# Patient Record
Sex: Male | Born: 1985 | Race: Black or African American | Hispanic: No | Marital: Single | Smoking: Former smoker
Health system: Southern US, Community
[De-identification: ages and names within clinical notes are randomized; demographics above are authoritative.]

## PROBLEM LIST (undated history)

## (undated) DIAGNOSIS — I10 Essential (primary) hypertension: Secondary | ICD-10-CM

## (undated) HISTORY — PX: CYST EXCISION: SHX5701

---
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
## 2008-04-28 ENCOUNTER — Emergency Department (HOSPITAL_COMMUNITY): Admission: EM | Admit: 2008-04-28 | Discharge: 2008-04-28 | Payer: Self-pay | Admitting: Family Medicine

## 2009-10-25 ENCOUNTER — Emergency Department (HOSPITAL_COMMUNITY): Admission: EM | Admit: 2009-10-25 | Discharge: 2009-10-25 | Payer: Self-pay | Admitting: Family Medicine

## 2014-11-11 ENCOUNTER — Encounter (HOSPITAL_COMMUNITY): Payer: Self-pay

## 2014-11-11 ENCOUNTER — Emergency Department (HOSPITAL_COMMUNITY): Payer: Self-pay

## 2014-11-11 ENCOUNTER — Emergency Department (HOSPITAL_COMMUNITY)
Admission: EM | Admit: 2014-11-11 | Discharge: 2014-11-11 | Disposition: A | Discharge status: Home / Self Care | Payer: Self-pay | Attending: Emergency Medicine | Admitting: Emergency Medicine

## 2014-11-11 DIAGNOSIS — Y998 Other external cause status: Secondary | ICD-10-CM | POA: Insufficient documentation

## 2014-11-11 DIAGNOSIS — Z87891 Personal history of nicotine dependence: Secondary | ICD-10-CM | POA: Insufficient documentation

## 2014-11-11 DIAGNOSIS — X58XXXA Exposure to other specified factors, initial encounter: Secondary | ICD-10-CM | POA: Insufficient documentation

## 2014-11-11 DIAGNOSIS — Y9289 Other specified places as the place of occurrence of the external cause: Secondary | ICD-10-CM | POA: Insufficient documentation

## 2014-11-11 DIAGNOSIS — S93601A Unspecified sprain of right foot, initial encounter: Secondary | ICD-10-CM | POA: Insufficient documentation

## 2014-11-11 DIAGNOSIS — Y9339 Activity, other involving climbing, rappelling and jumping off: Secondary | ICD-10-CM | POA: Insufficient documentation

## 2014-11-11 NOTE — ED Provider Notes (Signed)
CSN: [id number]     Arrival date & time 11/11/14  1349 History  This chart was scribed for Marlon Pel, PA-C, working with Nelva Nay, MD by Chestine Spore, ED Scribe. The patient was seen in room TR11C/TR11C at 3:00 PM.    Chief Complaint  Patient presents with  . Foot Injury      The history is provided by the patient. No language interpreter was used.    HPI Comments: Jose Clayton is a 29 y.o. male who presents to the Emergency Department complaining of right foot injury onset last night. Pt reports that he was dancing at the time and he jumped high and when he landed he twisted his foot. Pt states that he is having pain with ambulating but he is able to ambulate. He states that he is having associated symptoms of joint swelling. He denies color change, wound, rash, numbness, weakness, and any other symptoms. No head/neck injury, loc or syncope. Pt works at [employer].  History reviewed. No pertinent past medical history. Past Surgical History  Procedure Laterality Date  . Cyst excision      penis   History reviewed. No pertinent family history. History  Substance Use Topics  . Smoking status: Former Games developer  . Smokeless tobacco: Not on file  . Alcohol Use: 1.8 oz/week    3 Cans of beer per week    Review of Systems  Musculoskeletal: Positive for joint swelling and arthralgias.  Skin: Negative for color change, rash and wound.  Neurological: Negative for weakness and numbness.   Allergies  Review of patient's allergies indicates no known allergies.  Home Medications   Prior to Admission medications   Not on File   BP 143/94 mmHg  Pulse 96  Temp(Src) 98.1 F (36.7 C) (Oral)  Resp 16  Ht  (1.575 m)  Wt 135 lb (61.236 kg)  BMI 24.69 kg/m2  SpO2 97% Physical Exam  Constitutional: He is oriented to person, place, and time. He appears well-developed and well-nourished. No distress.  HENT:  Head: Normocephalic and atraumatic.  Eyes: EOM are normal.  Neck:  Neck supple. No tracheal deviation present.  Cardiovascular: Normal rate.   Pulmonary/Chest: Effort normal. No respiratory distress.  Musculoskeletal: Normal range of motion.       Right foot: There is tenderness, bony tenderness and swelling. There is normal range of motion, normal capillary refill, no crepitus, no deformity and no laceration.       Feet:  Neurological: He is alert and oriented to person, place, and time.  Skin: Skin is warm and dry.  Psychiatric: He has a normal mood and affect. His behavior is normal.  Nursing note and vitals reviewed.   ED Course  Procedures (including critical care time) DIAGNOSTIC STUDIES: Oxygen Saturation is 97% on RA, nl by my interpretation.    COORDINATION OF CARE: 3:02 PM-Discussed treatment plan with pt at bedside and pt agreed to plan.   Labs Review Labs Reviewed - No data to display  Imaging Review Dg Foot Complete Right  11/11/2014   CLINICAL DATA:  Right foot injury/pain  EXAM: RIGHT FOOT COMPLETE - 3+ VIEW  COMPARISON:  None.  FINDINGS: No fracture or dislocation is seen.  The joint spaces are preserved.  The visualized soft tissues are unremarkable.  IMPRESSION: No fracture or dislocation is seen.   Electronically Signed   By: Charline Bills M.D.   On: 11/11/2014 14:47     EKG Interpretation None  MDM   Final diagnoses:  Foot sprain, right, initial encounter    ASO ankle and crutches applied to right foot, referral to Ortho. No acute findings and no fracture on xray.  Medications - No data to display  29 y.o.Jose Clayton's evaluation in the Emergency Department is complete. It has been determined that no acute conditions requiring further emergency intervention are present at this time. The patient/guardian have been advised of the diagnosis and plan. We have discussed signs and symptoms that warrant return to the ED, such as changes or worsening in symptoms.  Vital signs are stable at discharge. Filed  Vitals:   11/11/14 1357  BP: 143/94  Pulse: 96  Temp: 98.1 F (36.7 C)  Resp: 16    Patient/guardian has voiced understanding and agreed to follow-up with the PCP or specialist.   I personally performed the services described in this documentation, which was scribed in my presence. The recorded information has been reviewed and is accurate.    Marlon Peliffany Maxie Debose, PA-C 11/11/14 1511  Nelva Nayobert Beaton, MD 11/12/14 0930

## 2014-11-11 NOTE — ED Notes (Signed)
Onset last night pt jumping up and down on right ankle and turned ankle.  Unable to bear weight on right foot.

## 2014-11-11 NOTE — Discharge Instructions (Signed)
Foot Sprain The muscles and cord like structures which attach muscle to bone (tendons) that surround the feet are made up of units. A foot sprain can occur at the weakest spot in any of these units. This condition is most often caused by injury to or overuse of the foot, as from playing contact sports, or aggravating a previous injury, or from poor conditioning, or obesity. SYMPTOMS  Pain with movement of the foot.  Tenderness and swelling at the injury site.  Loss of strength is present in moderate or severe sprains. THE THREE GRADES OR SEVERITY OF FOOT SPRAIN ARE:  Mild (Grade I): Slightly pulled muscle without tearing of muscle or tendon fibers or loss of strength.  Moderate (Grade II): Tearing of fibers in a muscle, tendon, or at the attachment to bone, with small decrease in strength.  Severe (Grade III): Rupture of the muscle-tendon-bone attachment, with separation of fibers. Severe sprain requires surgical repair. Often repeating (chronic) sprains are caused by overuse. Sudden (acute) sprains are caused by direct injury or over-use. DIAGNOSIS  Diagnosis of this condition is usually by your own observation. If problems continue, a caregiver may be required for further evaluation and treatment. X-rays may be required to make sure there are not breaks in the bones (fractures) present. Continued problems may require physical therapy for treatment. PREVENTION  Use strength and conditioning exercises appropriate for your sport.  Warm up properly prior to working out.  Use athletic shoes that are made for the sport you are participating in.  Allow adequate time for healing. Early return to activities makes repeat injury more likely, and can lead to an unstable arthritic foot that can result in prolonged disability. Mild sprains generally heal in 3 to 10 days, with moderate and severe sprains taking 2 to 10 weeks. Your caregiver can help you determine the proper time required for  healing. HOME CARE INSTRUCTIONS   Apply ice to the injury for 15-20 minutes, 03-04 times per day. Put the ice in a plastic bag and place a towel between the bag of ice and your skin.  An elastic wrap (like an Ace bandage) may be used to keep swelling down.  Keep foot above the level of the heart, or at least raised on a footstool, when swelling and pain are present.  Try to avoid use other than gentle range of motion while the foot is painful. Do not resume use until instructed by your caregiver. Then begin use gradually, not increasing use to the point of pain. If pain does develop, decrease use and continue the above measures, gradually increasing activities that do not cause discomfort, until you gradually achieve normal use.  Use crutches if and as instructed, and for the length of time instructed.  Keep injured foot and ankle wrapped between treatments.  Massage foot and ankle for comfort and to keep swelling down. Massage from the toes up towards the knee.  Only take over-the-counter or prescription medicines for pain, discomfort, or fever as directed by your caregiver. SEEK IMMEDIATE MEDICAL CARE IF:   Your pain and swelling increase, or pain is not controlled with medications.  You have loss of feeling in your foot or your foot turns cold or blue.  You develop new, unexplained symptoms, or an increase of the symptoms that brought you to your caregiver. MAKE SURE YOU:   Understand these instructions.  Will watch your condition.  Will get help right away if you are not doing well or get worse. Document Released:   10/03/2001 Document Revised: 07/06/2011 Document Reviewed: 12/01/2007 ExitCare Patient Information 2015 [employer]. This information is not intended to replace advice given to you by your health care provider. Make sure you discuss any questions you have with your health care provider.  

## 2014-11-11 NOTE — ED Notes (Signed)
Patient is alert and orientedx4.  Patient was explained discharge instructions and they understood them with no questions.   

## 2016-11-26 IMAGING — CR DG FOOT COMPLETE 3+V*R*
3 series · 3 of 3 positions shown · non-contrast
Comparison: None.

CLINICAL DATA: Right foot injury/pain

EXAM:
RIGHT FOOT COMPLETE - 3+ VIEW

[foot ap]
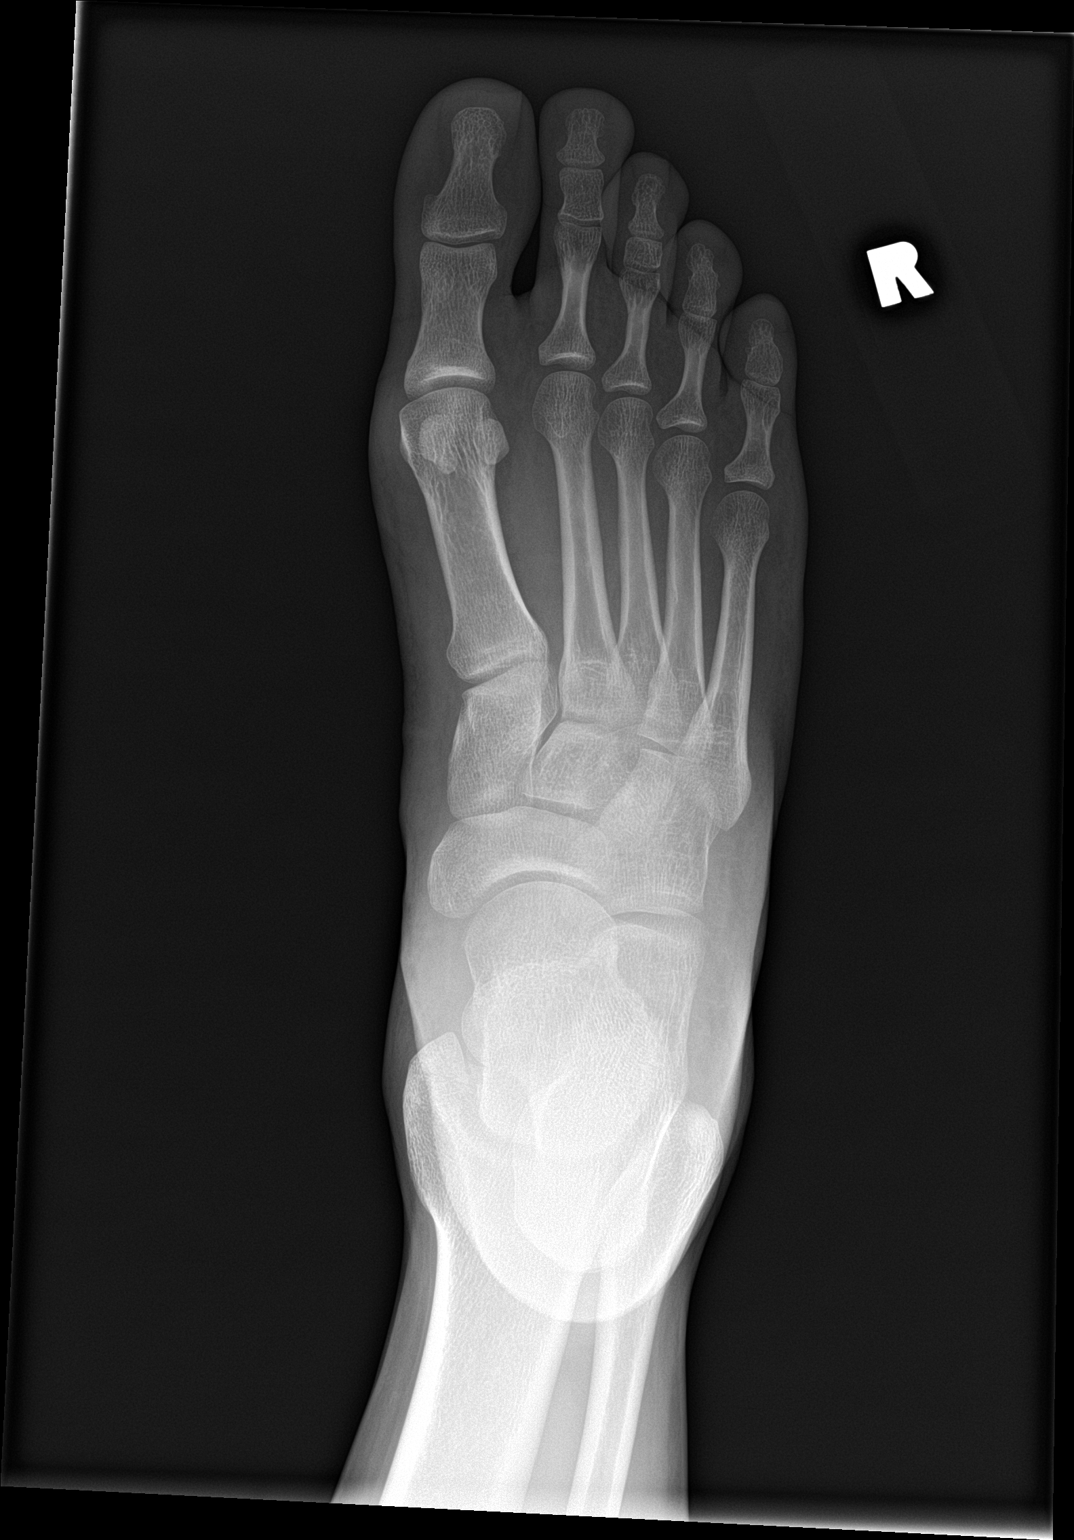

[foot obl]
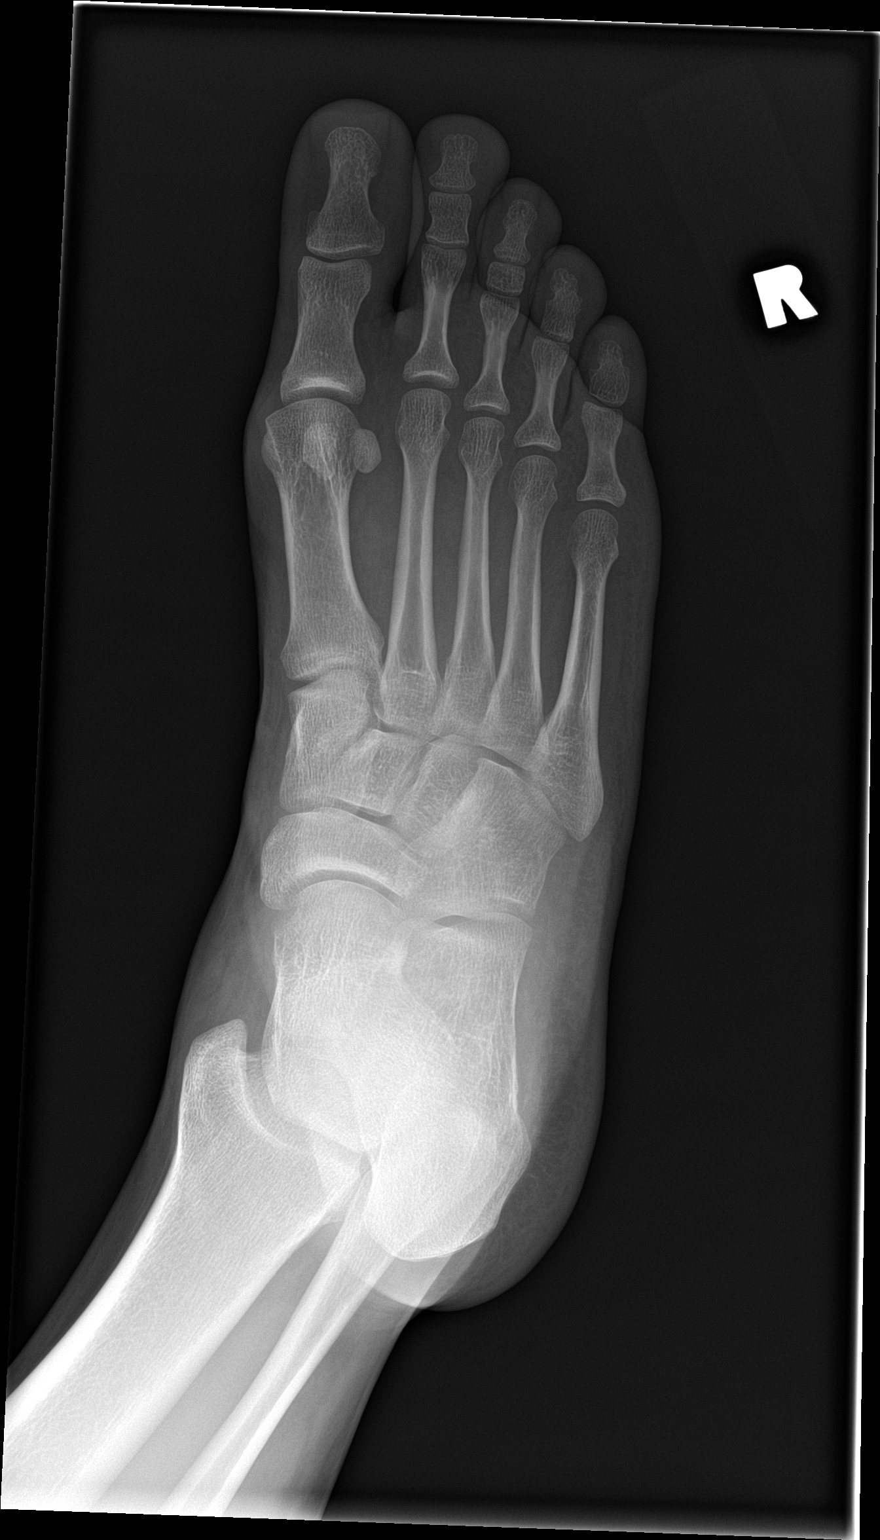

[foot lat]
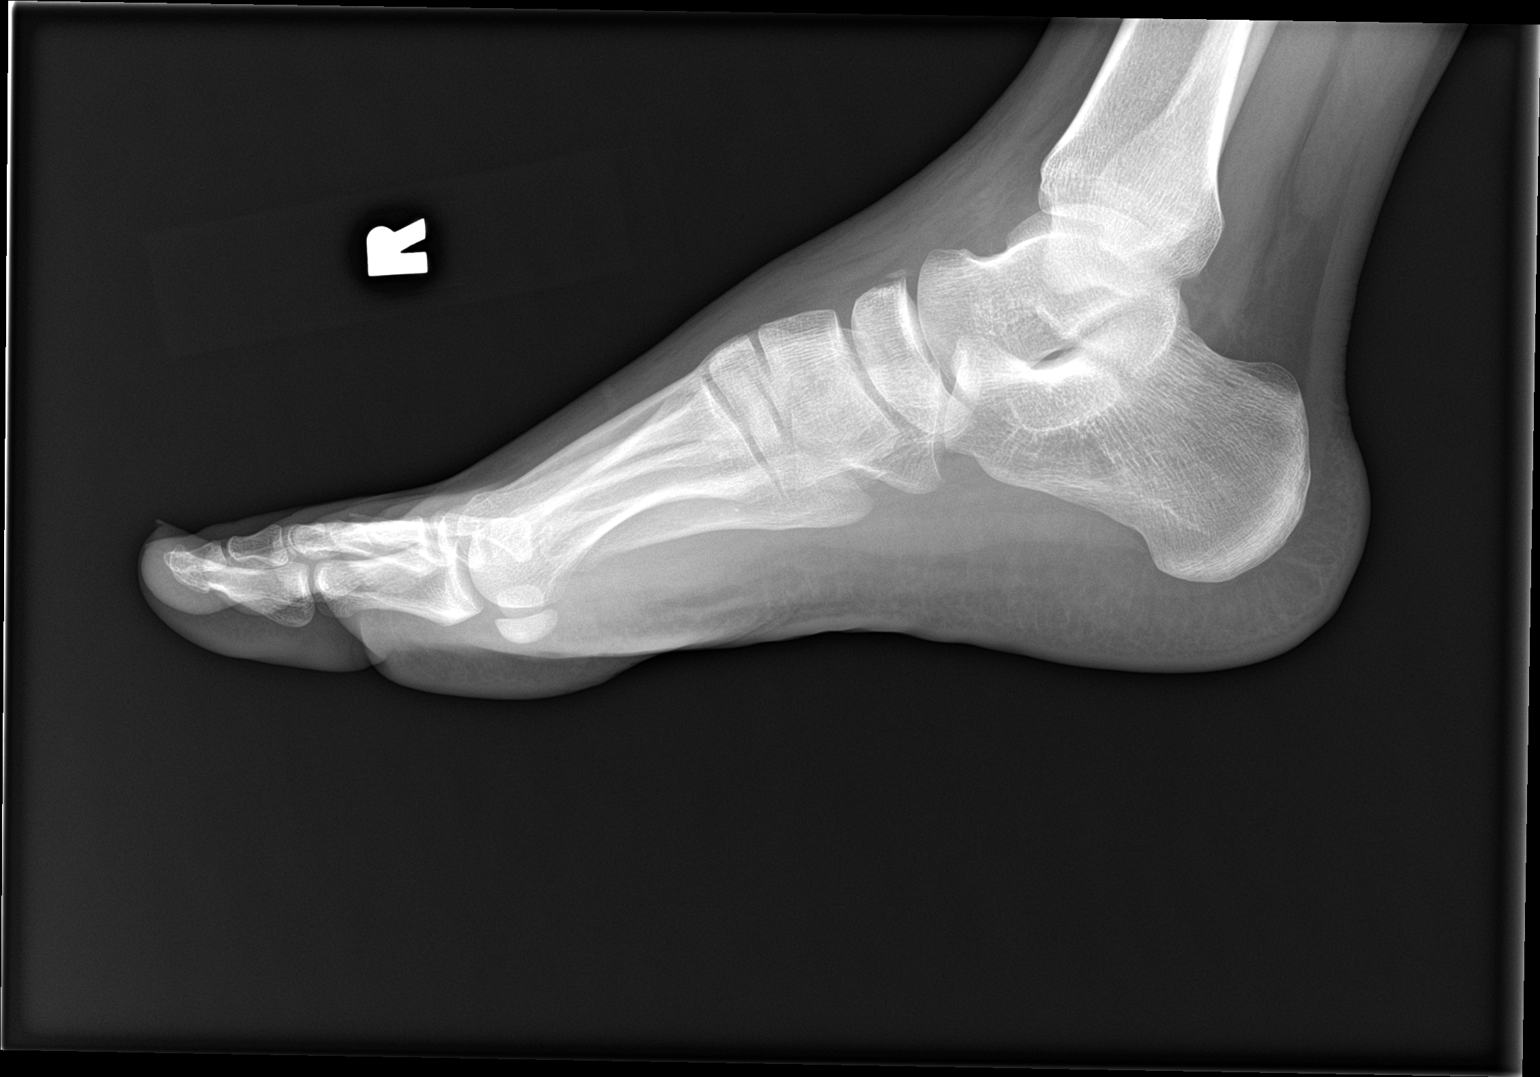

[3 of 3 positions shown; findings below may reference images not displayed]

FINDINGS: No fracture or dislocation is seen.

The joint spaces are preserved.

The visualized soft tissues are unremarkable.
IMPRESSION: No fracture or dislocation is seen.

## 2017-02-11 ENCOUNTER — Emergency Department (HOSPITAL_COMMUNITY)
Admission: EM | Admit: 2017-02-11 | Discharge: 2017-02-11 | Disposition: A | Discharge status: Home / Self Care | Payer: Self-pay | Attending: Emergency Medicine | Admitting: Emergency Medicine

## 2017-02-11 ENCOUNTER — Encounter (HOSPITAL_COMMUNITY): Payer: Self-pay

## 2017-02-11 DIAGNOSIS — Z87891 Personal history of nicotine dependence: Secondary | ICD-10-CM | POA: Insufficient documentation

## 2017-02-11 DIAGNOSIS — R51 Headache: Secondary | ICD-10-CM | POA: Insufficient documentation

## 2017-02-11 DIAGNOSIS — R519 Headache, unspecified: Secondary | ICD-10-CM

## 2017-02-11 DIAGNOSIS — M542 Cervicalgia: Secondary | ICD-10-CM | POA: Insufficient documentation

## 2017-02-11 DIAGNOSIS — R110 Nausea: Secondary | ICD-10-CM | POA: Insufficient documentation

## 2017-02-11 MED ORDER — PROCHLORPERAZINE MALEATE 5 MG PO TABS
10.0000 mg | ORAL_TABLET | Freq: Once | ORAL | Status: AC
  Administered 2017-02-11: 10 mg via ORAL
  Filled 2017-02-11: qty 2

## 2017-02-11 MED ORDER — BUTALBITAL-APAP-CAFFEINE 50-325-40 MG PO TABS: 1.0000 | ORAL_TABLET | Freq: Four times a day (QID) | ORAL | 0 refills | Status: DC | PRN

## 2017-02-11 MED ORDER — DEXAMETHASONE SODIUM PHOSPHATE 10 MG/ML IJ SOLN
10.0000 mg | Freq: Once | INTRAMUSCULAR | Status: AC
  Administered 2017-02-11: 10 mg via INTRAMUSCULAR
  Filled 2017-02-11: qty 1

## 2017-02-11 MED ORDER — KETOROLAC TROMETHAMINE 10 MG PO TABS
20.0000 mg | ORAL_TABLET | Freq: Once | ORAL | Status: AC
  Administered 2017-02-11: 20 mg via ORAL
  Filled 2017-02-11: qty 2

## 2017-02-11 MED ORDER — BUTALBITAL-APAP-CAFFEINE 50-325-40 MG PO TABS: 1.0000 | ORAL_TABLET | Freq: Four times a day (QID) | ORAL | 0 refills | Status: AC | PRN

## 2017-02-11 NOTE — Discharge Instructions (Signed)
I suspect your headaches may be from stress, dehydration, or rebound from ibuprofen/tylenol. Or all three combined.   As best you can try to cut back on amount of ibuprofen and tylenol.  Stay well hydrated (2-3L water daily). Avoid caffeine, tobacco, any other drugs including energy drinks.  Try to get at least 7-8 hours of sleep nightly. Take fioricet for break through headaches. If stress is significant in your life, try relaxation techniques or massages.   Return to ED if your headache is severe and sudden, associated with neck stiffness, vision loss, vomiting, numbness or weakness in your extremities, fevers  Contact [facility] to establish care with a primary care provider for regular, routine medical care.  This clinic accepts patients without medical insurance. A primary care provider can adjust your daily medications and give you refills.  You may need evaluation by neurology if symptoms not well controlled by a primary care provider.

## 2017-02-11 NOTE — ED Provider Notes (Signed)
[facility]   CSN: [id number] Arrival date & time: 02/11/17  1532     History   Chief Complaint Chief Complaint  Patient presents with  . Headache    HPI Jose Clayton is a 31 y.o. male presents to ED for evaluation of recurrent, daily, posterior headaches x 3-4 weeks. Headaches are gradual in onset and radiate to top of head. Associated with nausea, neck and back soreness, temporary blurred vision. Blurred vision "lasts only a few seconds" and goes away with blinking. Has been taking alternating ibuprofen/tylenol daily, multiple times a day. Mother and sister have migraine headaches. Denies fevers, chills, neck stiffness, vision loss, weakness or numbness, gait difficulty, speech difficulty. No exertional headaches. Headaches do not wake him up in the night. No h/o immunosuppresion. Does not drink coffee or smoke cigarettes. No medication changes recently. Occasional ETOH use.   HPI  History reviewed. No pertinent past medical history.  There are no active problems to display for this patient.   Past Surgical History:  Procedure Laterality Date  . CYST EXCISION     penis       Home Medications    Prior to Admission medications   Medication Sig Start Date End Date Taking? Authorizing Provider  acetaminophen (TYLENOL) 500 MG tablet Take 1,000 mg by mouth every 6 (six) hours as needed.   Yes [provider]  ibuprofen (ADVIL,MOTRIN) 200 MG tablet Take 400 mg by mouth every 6 (six) hours as needed for headache.   Yes [provider]  butalbital-acetaminophen-caffeine (FIORICET, ESGIC) 50-325-40 MG tablet Take 1-2 tablets by mouth every 6 (six) hours as needed for headache. 02/11/17 02/11/18  Liberty HandyGibbons, Angenette Daily J, PA-C    Family History No family history on file.  Social History Social History  Substance Use Topics  . Smoking status: Former Games developermoker  . Smokeless tobacco: Never Used  . Alcohol use 1.8  oz/week    3 Cans of beer per week     Allergies   Patient has no known allergies.   Review of Systems Review of Systems  Constitutional: Negative for chills and fever.  HENT: Negative for sinus pain and sinus pressure.   Eyes: Positive for visual disturbance. Negative for photophobia.  Gastrointestinal: Positive for nausea.  Musculoskeletal: Positive for myalgias and neck pain. Negative for neck stiffness.  Allergic/Immunologic: Negative for immunocompromised state.  Neurological: Positive for headaches. Negative for syncope, speech difficulty, weakness, light-headedness and numbness.     Physical Exam Updated Vital Signs BP (!) 147/100 (BP Location: Right Arm)   Pulse 72   Temp 97.9 F (36.6 C) (Oral)   Resp 12   Wt 61.2 kg (135 lb)   SpO2 99%   BMI 24.69 kg/m   Physical Exam  Constitutional: He is oriented to person, place, and time. He appears well-developed and well-nourished. No distress.  NAD.  HENT:  Head: Normocephalic and atraumatic.  Right Ear: External ear normal.  Left Ear: External ear normal.  Nose: Nose normal.  No nystagmus  Eyes: Conjunctivae are normal. No scleral icterus.  Neck: Normal range of motion. Neck supple.  Neck is supple, no neck rigidity, no meningeal signs No cervical lymphadenopathy   Cardiovascular: Normal rate, regular rhythm, normal heart sounds and intact distal pulses.   No murmur heard. Pulmonary/Chest: Effort normal and breath sounds normal. He has no wheezes.  Musculoskeletal: Normal range of motion. He exhibits no deformity.  Neurological: He is alert and oriented to person,  place, and time.  Speech and phonation normal.  Strength 5/5 with hand grip and ankle flexion/extension.   Sensation to light touch intact in hands and feet. Gait normal.   Negative Romberg. Intact finger to nose test. CN I not tested CN II full visual fields bilaterally CN III, IV, VI PEERL and EOMs intact bilaterally CN V light touch intact in  all 3 divisions of trigeminal nerve CN VII facial nerve movements intact, symmetric, bilaterally CN VIII hearing intact to finger rub, bilaterally CN IX, X no uvula deviation, symmetric soft palate rise CN XI 5/5 SCM and trapezius strength bilaterally  CN XII Tongue midline with symmetric L/R movement  Skin: Skin is warm and dry. Capillary refill takes less than 2 seconds.  Psychiatric: He has a normal mood and affect. His behavior is normal. Judgment and thought content normal.  Nursing note and vitals reviewed.    ED Treatments / Results  Labs (all labs ordered are listed, but only abnormal results are displayed) Labs Reviewed - No data to display  EKG  EKG Interpretation None       Radiology No results found.  Procedures Procedures (including critical care time)  Medications Ordered in ED Medications  ketorolac (TORADOL) tablet 20 mg (not administered)  prochlorperazine (COMPAZINE) tablet 10 mg (10 mg Oral Given 02/11/17 1849)  dexamethasone (DECADRON) injection 10 mg (10 mg Intramuscular Given 02/11/17 1850)     Initial Impression / Assessment and Plan / ED Course  I have reviewed the triage vital signs and the nursing notes.  Pertinent labs & imaging results that were available during my care of the patient were reviewed by me and considered in my medical decision making (see chart for details).    Patient is a 31 y.o. yo male with recurrent, gradual in onset, headaches x 3-4 weeks. Has been responsive to ibuprofen and tylenol, but return a few hours after medicine. Patient is without high-risk features of headache including: sudden onset/thunderclap HA, AMS, accompanying seizure, headache with exertion, age > 20, history of immunocompromise, neck rigidity, fever, use of anticoagulation, family history of spontaneous SAH, concomitant drug use or toxic exposure. Exam is unremarkable with no meningismus, no nystagmus, no focal neuro deficits, no pain over temporal  arteries.  Imaging with CT/MRI not indicated given history and physical exam findings. No emergent intracranial or vascular pathology suspected or identified by history, physical exam.  No indications for hospitalization identified. Pt given oral migraine cocktail in emergency department with partial resolution of headache. Pt tolerated PO fluids and ambulated in ED without difficulty.   Strict ED return precautions given. Pt will be discharged with symptomatic treatment of headaches.  Pt is aware of red flag symptoms of headaches that would warrant return to ED. Pt verbalized understanding and is agreeable to discharge plan. Given [facility] and neuro contact to f/u as needed. Advised hydration, rest, stress techniques and to cut back of NSAIDs. Suspect analgesic rebound HA.   Final Clinical Impressions(s) / ED Diagnoses   Final diagnoses:  Recurrent headache    New Prescriptions Current Discharge Medication List    START taking these medications   Details  butalbital-acetaminophen-caffeine (FIORICET, ESGIC) 50-325-40 MG tablet Take 1-2 tablets by mouth every 6 (six) hours as needed for headache. Qty: 20 tablet, Refills: 0         Jerrell Mylar 02/11/17 1854    Lavera Guise, MD 02/13/17 [phone]

## 2017-02-11 NOTE — ED Triage Notes (Signed)
Pt reports headache "all over x 3 weeks. Pt states he has been taking Ibuprofen OC with relief but headache returns when medication wears off. Denies photosensitivity blurred vision. NAD VSS

## 2021-06-10 ENCOUNTER — Emergency Department (HOSPITAL_COMMUNITY)
Admission: EM | Admit: 2021-06-10 | Discharge: 2021-06-10 | Disposition: A | Discharge status: Home / Self Care | Payer: Self-pay | Attending: Emergency Medicine | Admitting: Emergency Medicine

## 2021-06-10 ENCOUNTER — Encounter (HOSPITAL_COMMUNITY): Payer: Self-pay | Admitting: Emergency Medicine

## 2021-06-10 ENCOUNTER — Other Ambulatory Visit: Payer: Self-pay

## 2021-06-10 ENCOUNTER — Emergency Department (HOSPITAL_COMMUNITY): Payer: Self-pay

## 2021-06-10 DIAGNOSIS — Z79899 Other long term (current) drug therapy: Secondary | ICD-10-CM | POA: Insufficient documentation

## 2021-06-10 DIAGNOSIS — R319 Hematuria, unspecified: Secondary | ICD-10-CM | POA: Insufficient documentation

## 2021-06-10 DIAGNOSIS — I10 Essential (primary) hypertension: Secondary | ICD-10-CM | POA: Insufficient documentation

## 2021-06-10 DIAGNOSIS — R109 Unspecified abdominal pain: Secondary | ICD-10-CM | POA: Insufficient documentation

## 2021-06-10 HISTORY — DX: Essential (primary) hypertension: I10

## 2021-06-10 LAB — COMPREHENSIVE METABOLIC PANEL
ALT: 36 U/L (ref 0–44)
AST: 29 U/L (ref 15–41)
Albumin: 4.3 g/dL (ref 3.5–5.0)
Alkaline Phosphatase: 45 U/L (ref 38–126)
Anion gap: 9 (ref 5–15)
BUN: 20 mg/dL (ref 6–20)
CO2: 27 mmol/L (ref 22–32)
Calcium: 9.7 mg/dL (ref 8.9–10.3)
Chloride: 100 mmol/L (ref 98–111)
Creatinine, Ser: 1.1 mg/dL (ref 0.61–1.24)
GFR, Estimated: >60 mL/min (ref >60)
Glucose, Bld: 100 mg/dL — ABNORMAL HIGH (ref 70–99)
Potassium: 4.1 mmol/L (ref 3.5–5.1)
Sodium: 136 mmol/L (ref 135–145)
Total Bilirubin: 0.4 mg/dL (ref 0.3–1.2)
Total Protein: 7.3 g/dL (ref 6.5–8.1)

## 2021-06-10 LAB — CBC WITH DIFFERENTIAL/PLATELET
Abs Immature Granulocytes: 0.04 10*3/uL (ref 0.00–0.07)
Basophils Absolute: 0 10*3/uL (ref 0.0–0.1)
Basophils Relative: 0 %
Eosinophils Absolute: 0.1 10*3/uL (ref 0.0–0.5)
Eosinophils Relative: 1 %
HCT: 40.2 % (ref 39.0–52.0)
Hemoglobin: 13.6 g/dL (ref 13.0–17.0)
Immature Granulocytes: 0 %
Lymphocytes Relative: 27 %
Lymphs Abs: 2.6 10*3/uL (ref 0.7–4.0)
MCH: 30.9 pg (ref 26.0–34.0)
MCHC: 33.8 g/dL (ref 30.0–36.0)
MCV: 91.4 fL (ref 80.0–100.0)
Monocytes Absolute: 0.9 10*3/uL (ref 0.1–1.0)
Monocytes Relative: 10 %
Neutro Abs: 5.8 10*3/uL (ref 1.7–7.7)
Neutrophils Relative %: 62 %
Platelets: 251 10*3/uL (ref 150–400)
RBC: 4.4 MIL/uL (ref 4.22–5.81)
RDW: 13.8 % (ref 11.5–15.5)
WBC: 9.5 10*3/uL (ref 4.0–10.5)
nRBC: 0 % (ref 0.0–0.2)

## 2021-06-10 LAB — URINALYSIS, ROUTINE W REFLEX MICROSCOPIC
Bilirubin Urine: NEGATIVE
Glucose, UA: NEGATIVE mg/dL
Ketones, ur: NEGATIVE mg/dL
Leukocytes,Ua: NEGATIVE
Nitrite: NEGATIVE
Protein, ur: NEGATIVE mg/dL
Specific Gravity, Urine: 1.02 (ref 1.005–1.030)
pH: 7 (ref 5.0–8.0)

## 2021-06-10 LAB — LIPASE, BLOOD: Lipase: 30 U/L (ref 11–51)

## 2021-06-10 MED ORDER — AMLODIPINE BESYLATE 5 MG PO TABS: 5.0000 mg | ORAL_TABLET | Freq: Every day | ORAL | 0 refills | Status: AC

## 2021-06-10 NOTE — ED Provider Notes (Signed)
[facility] EMERGENCY DEPARTMENT Provider Note   CSN: [id number] Arrival date & time: 06/10/21  1433     History  Chief Complaint  Patient presents with   Abdominal Pain    Jose Clayton is a 36 y.o. male.   Abdominal Pain Patient presents for abdominal pain.  Left-sided.  Has had there for a year.  Comes and goes in intensity but states he is always having pain.  States he feels that he may be urinating little more frequently.  No nausea or vomiting.  States at times will of diarrhea and sometimes constipation.  Not necessarily associate with the pain.  States the pain may be better after urinating.  No blood in the urine.  No history of hypertension but states his blood pressure is also not necessary been checked in a year.  Home Medications Prior to Admission medications   Medication Sig Start Date End Date Taking? Authorizing Provider  amLODipine (NORVASC) 5 MG tablet Take 1 tablet (5 mg total) by mouth daily. 06/10/21  Yes Davonna Belling, MD  acetaminophen (TYLENOL) 500 MG tablet Take 1,000 mg by mouth every 6 (six) hours as needed.    [provider]  ibuprofen (ADVIL,MOTRIN) 200 MG tablet Take 400 mg by mouth every 6 (six) hours as needed for headache.    [provider]      Allergies    Patient has no known allergies.    Review of Systems   Review of Systems  Constitutional:  Negative for appetite change.  Gastrointestinal:  Positive for abdominal pain.  Genitourinary:  Positive for flank pain.  Musculoskeletal:  Negative for back pain.  Skin:  Negative for pallor.  Psychiatric/Behavioral:  Negative for confusion.    Physical Exam Updated Vital Signs BP (!) 182/110 (BP Location: Right Arm)    Pulse 78    Temp 98 F (36.7 C) (Oral)    Resp 20    Ht 5\' 2"  (1.575 m)    Wt 61.2 kg    SpO2 100%    BMI 24.69 kg/m  Physical Exam Vitals and nursing note reviewed.  HENT:     Head: Normocephalic.  Cardiovascular:     Rate and  Rhythm: Normal rate and regular rhythm.  Pulmonary:     Breath sounds: Normal breath sounds.  Abdominal:     Tenderness: There is no abdominal tenderness.     Hernia: No hernia is present.  Genitourinary:    Comments: No CVA tenderness. Neurological:     Mental Status: He is alert.    ED Results / Procedures / Treatments   Labs (all labs ordered are listed, but only abnormal results are displayed) Labs Reviewed  COMPREHENSIVE METABOLIC PANEL - Abnormal; Notable for the following components:      Result Value   Glucose, Bld 100 (*)    All other components within normal limits  URINALYSIS, ROUTINE W REFLEX MICROSCOPIC - Abnormal; Notable for the following components:   Hgb urine dipstick SMALL (*)    Bacteria, UA RARE (*)    All other components within normal limits  CBC WITH DIFFERENTIAL/PLATELET  LIPASE, BLOOD    EKG None  Radiology CT Renal Stone Study  Result Date: 06/10/2021 CLINICAL DATA:  LEFT flank pain, kidney stone suspected EXAM: CT ABDOMEN AND PELVIS WITHOUT CONTRAST TECHNIQUE: Multidetector CT imaging of the abdomen and pelvis was performed following the standard protocol without IV contrast. RADIATION DOSE REDUCTION: This exam was performed according to the departmental dose-optimization  program which includes automated exposure control, adjustment of the mA and/or kV according to patient size and/or use of iterative reconstruction technique. COMPARISON:  None. FINDINGS: Lower chest: Lung bases are clear. Hepatobiliary: No focal hepatic lesion. No biliary duct dilatation. Common bile duct is normal. Pancreas: Pancreas is normal. No ductal dilatation. No pancreatic inflammation. Spleen: Normal spleen Adrenals/urinary tract: Adrenal glands normal. No nephrolithiasis or ureterolithiasis. No obstructive uropathy. No bladder calculi. Low-density lesion in the LEFT renal cortex has simple fluid attenuation measuring 19 mm. Stomach/Bowel: Stomach, small bowel, appendix, and  cecum are normal. The colon and rectosigmoid colon are normal. Vascular/Lymphatic: Abdominal aorta is normal caliber. No periportal or retroperitoneal adenopathy. No pelvic adenopathy. Reproductive: Prostate unremarkable Other: No free fluid. Musculoskeletal: No aggressive osseous lesion IMPRESSION: 1. No nephrolithiasis, ureterolithiasis, or obstructive uropathy. 2. No bladder calculi. 3. No acute findings in the abdomen pelvis. 4. Benign LEFT renal cysts. Electronically Signed   By: Suzy Bouchard M.D.   On: 06/10/2021 16:03    Procedures Procedures    Medications Ordered in ED Medications - No data to display  ED Course/ Medical Decision Making/ A&P                           Medical Decision Making Problems Addressed: Abdominal pain, unspecified abdominal location: chronic illness or injury Hematuria, unspecified type: acute illness or injury Hypertension, unspecified type: undiagnosed new problem with uncertain prognosis  Risk Prescription drug management.   Patient presents with left flank pain.  Initial differential diagnosis is long and includes life-threatening conditions such as kidney stones and infection.  Urine showed mild hematuria.  Lab work otherwise reassuring.  CT scan and dependently reviewed by me and reassuring.  No clear cause of the pain.  No diverticulitis.  However does have hypertension here.  No chest pain.  No trouble breathing.  Good renal function.  Doubt acute endorgan damage.  Reviewing records previously showed hypertension couple years ago.  We will start some medicines.  Transition of care has been consulted due to lack of insurance and has helped arrange follow-up.  Will discharge home.        Final Clinical Impression(s) / ED Diagnoses Final diagnoses:  Abdominal pain, unspecified abdominal location  Hematuria, unspecified type  Hypertension, unspecified type    Rx / DC Orders ED Discharge Orders          Ordered    amLODipine (NORVASC) 5  MG tablet  Daily        06/10/21 1802              Davonna Belling, MD 06/11/21 0004

## 2021-06-10 NOTE — ED Provider Triage Note (Signed)
Emergency Medicine Provider Triage Evaluation Note  Jose Clayton , a 36 y.o. male  was evaluated in triage.  Pt complains of left flank pain.  Started sometime in 2021, states it got worse today.  Worse after he sleeps on it wrong.  No vomiting or nausea.  No hematuria.  "It just feels weirder today."  Review of Systems  Positive: Left flank pain Negative: N/v  Physical Exam  BP (!) 183/133 (BP Location: Left Arm)    Pulse 91    Temp 98.4 F (36.9 C) (Oral)    Resp 15    Ht 5\' 2"  (1.575 m)    Wt 61.2 kg    SpO2 99%    BMI 24.69 kg/m  Gen:   Awake, no distress   Resp:  Normal effort  MSK:   Moves extremities without difficulty  Other:  Abdomen soft, mild CVA tenderness  Medical Decision Making  Medically screening exam initiated at 3:17 PM.  Appropriate orders placed.  SIDNEY KOSAK was informed that the remainder of the evaluation will be completed by another provider, this initial triage assessment does not replace that evaluation, and the importance of remaining in the ED until their evaluation is complete.     Sherrill Raring, PA-C 06/10/21 1520

## 2021-06-10 NOTE — Progress Notes (Signed)
°   06/10/21 1738  TOC ED Mini Assessment  TOC Time spent with patient (minutes): 30  PING Used in TOC Assessment No  Admission or Readmission Diverted Yes  Interventions which prevented an admission or readmission Transportation Screening;PCP Appointment Scheduled  What brought you to the Emergency Department?  Flank pain  Barriers to Discharge No Barriers Identified  Barrier interventions CSW met with Pt at bedside. Pt currently does not have PCP. CSW contacted [facility] and made appointment for 07/03/21 at 1:30 pm. CSW informed Pt and ensured that Pt knows how to find entrance to office. Appointment info added to AVS  Means of departure Car (Friend will be picking him up.)  Key Contact 1 [facility]  Spoke with Melody  Contact Date 06/10/21  Contact time 1740  Contact Phone Number [phone]  Call outcome Appointment made 07/03/21

## 2021-06-10 NOTE — Progress Notes (Signed)
°   06/10/21 1741  Food Insecurity  Within the past 12 months, you worried that your food would run out before you got the money to buy more. Never true  Within the past 12 months, the food you bought just didn't last and you didn't have money to get more. Never true  SDOH Interventions  Food Insecurity Interventions Intervention Not Indicated

## 2021-06-10 NOTE — ED Triage Notes (Signed)
Patient c/o lower left sided pain since 2021. States pain is intermittent but worse today. Denies any N/V.

## 2021-07-03 ENCOUNTER — Inpatient Hospital Stay (INDEPENDENT_AMBULATORY_CARE_PROVIDER_SITE_OTHER): Payer: Self-pay | Admitting: Primary Care

## 2022-05-17 ENCOUNTER — Emergency Department (HOSPITAL_COMMUNITY)
Admission: EM | Admit: 2022-05-17 | Discharge: 2022-05-17 | Disposition: A | Discharge status: Home / Self Care | Payer: Managed Care, Other (non HMO) | Attending: Emergency Medicine | Admitting: Emergency Medicine

## 2022-05-17 ENCOUNTER — Emergency Department (HOSPITAL_COMMUNITY): Payer: Managed Care, Other (non HMO)

## 2022-05-17 ENCOUNTER — Other Ambulatory Visit: Payer: Self-pay

## 2022-05-17 ENCOUNTER — Encounter (HOSPITAL_COMMUNITY): Payer: Self-pay | Admitting: Pharmacy Technician

## 2022-05-17 DIAGNOSIS — J209 Acute bronchitis, unspecified: Secondary | ICD-10-CM | POA: Diagnosis not present

## 2022-05-17 DIAGNOSIS — R059 Cough, unspecified: Secondary | ICD-10-CM | POA: Diagnosis present

## 2022-05-17 DIAGNOSIS — R042 Hemoptysis: Secondary | ICD-10-CM

## 2022-05-17 DIAGNOSIS — R7989 Other specified abnormal findings of blood chemistry: Secondary | ICD-10-CM | POA: Insufficient documentation

## 2022-05-17 DIAGNOSIS — Z1152 Encounter for screening for COVID-19: Secondary | ICD-10-CM | POA: Insufficient documentation

## 2022-05-17 LAB — RESP PANEL BY RT-PCR (RSV, FLU A&B, COVID)  RVPGX2
Influenza A by PCR: NEGATIVE
Influenza B by PCR: NEGATIVE
Resp Syncytial Virus by PCR: NEGATIVE
SARS Coronavirus 2 by RT PCR: NEGATIVE

## 2022-05-17 LAB — COMPREHENSIVE METABOLIC PANEL
ALT: 20 U/L (ref 0–44)
AST: 19 U/L (ref 15–41)
Albumin: 4.1 g/dL (ref 3.5–5.0)
Alkaline Phosphatase: 53 U/L (ref 38–126)
Anion gap: 15 (ref 5–15)
BUN: 17 mg/dL (ref 6–20)
CO2: 20 mmol/L — ABNORMAL LOW (ref 22–32)
Calcium: 9 mg/dL (ref 8.9–10.3)
Chloride: 102 mmol/L (ref 98–111)
Creatinine, Ser: 1.31 mg/dL — ABNORMAL HIGH (ref 0.61–1.24)
GFR, Estimated: >60 mL/min (ref >60)
Glucose, Bld: 89 mg/dL (ref 70–99)
Potassium: 3.6 mmol/L (ref 3.5–5.1)
Sodium: 137 mmol/L (ref 135–145)
Total Bilirubin: 0.1 mg/dL — ABNORMAL LOW (ref 0.3–1.2)
Total Protein: 7.3 g/dL (ref 6.5–8.1)

## 2022-05-17 LAB — CBC WITH DIFFERENTIAL/PLATELET
Abs Immature Granulocytes: 0.03 10*3/uL (ref 0.00–0.07)
Basophils Absolute: 0.1 10*3/uL (ref 0.0–0.1)
Basophils Relative: 1 %
Eosinophils Absolute: 0.1 10*3/uL (ref 0.0–0.5)
Eosinophils Relative: 1 %
HCT: 44.3 % (ref 39.0–52.0)
Hemoglobin: 14.8 g/dL (ref 13.0–17.0)
Immature Granulocytes: 0 %
Lymphocytes Relative: 20 %
Lymphs Abs: 1.6 10*3/uL (ref 0.7–4.0)
MCH: 30.5 pg (ref 26.0–34.0)
MCHC: 33.4 g/dL (ref 30.0–36.0)
MCV: 91.3 fL (ref 80.0–100.0)
Monocytes Absolute: 0.6 10*3/uL (ref 0.1–1.0)
Monocytes Relative: 8 %
Neutro Abs: 5.6 10*3/uL (ref 1.7–7.7)
Neutrophils Relative %: 70 %
Platelets: 290 10*3/uL (ref 150–400)
RBC: 4.85 MIL/uL (ref 4.22–5.81)
RDW: 14.5 % (ref 11.5–15.5)
WBC: 8 10*3/uL (ref 4.0–10.5)
nRBC: 0 % (ref 0.0–0.2)

## 2022-05-17 LAB — LIPASE, BLOOD: Lipase: 28 U/L (ref 11–51)

## 2022-05-17 LAB — D-DIMER, QUANTITATIVE: D-Dimer, Quant: <0.27 ug/mL-FEU (ref 0.00–0.50)

## 2022-05-17 MED ORDER — PREDNISONE 10 MG PO TABS: 60.0000 mg | ORAL_TABLET | Freq: Every day | ORAL | 0 refills | Status: AC

## 2022-05-17 MED ORDER — BENZONATATE 100 MG PO CAPS: 100.0000 mg | ORAL_CAPSULE | Freq: Three times a day (TID) | ORAL | 0 refills | Status: AC

## 2022-05-17 NOTE — ED Provider Triage Note (Signed)
Emergency Medicine Provider Triage Evaluation Note  Jose Clayton , a 37 y.o. male  was evaluated in triage.  Pt complains of URI symptoms, abdominal pain, nausea, diarrhea, initially onset about one week ago.  Review of Systems  Positive: Runny nose, sore throat, cough, nausea, diarrhea, abdominal pain Negative: Fever, chills  Physical Exam  BP (!) 185/109   Pulse (!) 101   Temp 98.7 F (37.1 C) (Oral)   Resp 19   SpO2 97%  Gen:   Awake, no distress   Resp:  Normal effort  MSK:   Moves extremities without difficulty  Other:  Abdomen soft  Medical Decision Making  Medically screening exam initiated at 4:55 PM.  Appropriate orders placed.  Jose Clayton was informed that the remainder of the evaluation will be completed by another provider, this initial triage assessment does not replace that evaluation, and the importance of remaining in the ED until their evaluation is complete.     Etta Quill, NP 05/17/22 1702

## 2022-05-17 NOTE — Discharge Instructions (Addendum)
Your x-ray imaging was negative for signs of pneumonia and your laboratory evaluation was reassuring.  Recommend you continue hydrating at home with electrolyte containing solutions like Gatorade or Pedialyte, take Tylenol and ibuprofen for pain and fever, will prescribe Tessalon for cough and prednisone.  Return for any worsening symptoms to include moderate to large volume coughing of blood

## 2022-05-17 NOTE — ED Provider Notes (Signed)
[facility] Provider Note   CSN: [id number] Arrival date & time: 05/17/22  6295     History  Chief Complaint  Patient presents with   Cough    Jose Clayton is a 37 y.o. male.   Cough Associated symptoms: shortness of breath      37 year old male presenting to the emergency department with complaint of URI symptoms.  He endorses roughly 1 week of a cough, runny nose.  Denies any fevers, chills.  Denies any sick contacts.  Denies any other complaints.  He states that today when he coughed he noticed a few specks of blood when he coughed up sputum.  His cough is overall been very mildly productive.  Overall well-appearing, tolerating oral intake.  Denies any sore throat.  Denies any difficulty swallowing.  Home Medications Prior to Admission medications   Medication Sig Start Date End Date Taking? Authorizing Provider  benzonatate (TESSALON) 100 MG capsule Take 1 capsule (100 mg total) by mouth every 8 (eight) hours. 05/17/22  Yes Regan Lemming, MD  predniSONE (DELTASONE) 10 MG tablet Take 6 tablets (60 mg total) by mouth daily for 5 days. 05/17/22 05/22/22 Yes Regan Lemming, MD  acetaminophen (TYLENOL) 500 MG tablet Take 1,000 mg by mouth every 6 (six) hours as needed.    [provider]  amLODipine (NORVASC) 5 MG tablet Take 1 tablet (5 mg total) by mouth daily. 06/10/21   Davonna Belling, MD  ibuprofen (ADVIL,MOTRIN) 200 MG tablet Take 400 mg by mouth every 6 (six) hours as needed for headache.    [provider]      Allergies    Patient has no known allergies.    Review of Systems   Review of Systems  Respiratory:  Positive for cough and shortness of breath.   All other systems reviewed and are negative.   Physical Exam Updated Vital Signs BP (!) 185/109   Pulse (!) 101   Temp 98.7 F (37.1 C) (Oral)   Resp 19   SpO2 97%  Physical Exam Vitals and nursing note reviewed.  Constitutional:       General: He is not in acute distress.    Appearance: He is well-developed.  HENT:     Head: Normocephalic and atraumatic.     Mouth/Throat:     Mouth: Mucous membranes are moist.     Pharynx: No oropharyngeal exudate or posterior oropharyngeal erythema.  Eyes:     Conjunctiva/sclera: Conjunctivae normal.  Cardiovascular:     Rate and Rhythm: Regular rhythm. Tachycardia present.  Pulmonary:     Effort: Pulmonary effort is normal. No respiratory distress.     Breath sounds: Normal breath sounds.  Abdominal:     Palpations: Abdomen is soft.     Tenderness: There is no abdominal tenderness.  Musculoskeletal:        General: No swelling.     Cervical back: Neck supple.  Skin:    General: Skin is warm and dry.     Capillary Refill: Capillary refill takes less than 2 seconds.  Neurological:     Mental Status: He is alert.  Psychiatric:        Mood and Affect: Mood normal.     ED Results / Procedures / Treatments   Labs (all labs ordered are listed, but only abnormal results are displayed) Labs Reviewed  COMPREHENSIVE METABOLIC PANEL - Abnormal; Notable for the following components:      Result Value   CO2  20 (*)    Creatinine, Ser 1.31 (*)    Total Bilirubin 0.1 (*)    All other components within normal limits  RESP PANEL BY RT-PCR (RSV, FLU A&B, COVID)  [id number]  CBC WITH DIFFERENTIAL/PLATELET  LIPASE, BLOOD  D-DIMER, QUANTITATIVE    EKG None  Radiology DG Chest 2 View  Result Date: 05/17/2022 CLINICAL DATA:  cough, hemopytsis EXAM: CHEST - 2 VIEW COMPARISON:  None Available. FINDINGS: No consolidation. No visible pleural effusions or pneumothorax. Cardiomediastinal silhouette is within normal limits. Reverse S-shaped thoracolumbar curvature. IMPRESSION: No evidence of active cardiopulmonary disease. Electronically Signed   By: Margaretha Sheffield M.D.   On: 05/17/2022 19:04    Procedures Procedures    Medications Ordered in ED Medications - No data to display  ED  Course/ Medical Decision Making/ A&P                             Medical Decision Making Amount and/or Complexity of Data Reviewed Labs: ordered. Radiology: ordered.  Risk Prescription drug management.    37 year old male presenting to the emergency department with complaint of URI symptoms.  He endorses roughly 1 week of a cough, runny nose.  Denies any fevers, chills.  Denies any sick contacts.  Denies any other complaints.  He states that today when he coughed he noticed a few specks of blood when he coughed up sputum.  His cough is overall been very mildly productive.  Overall well-appearing, tolerating oral intake.  Denies any sore throat.  Denies any difficulty swallowing.   On arrival, the patient was afebrile, mildly tachycardic P101, RR 19, BP 185/109, saturating 97% on room air.  He has had no fevers.  On my exam, the patient is well-appearing and well-hydrated.  The patient's lungs are clear to auscultation bilaterally. Additionally, the patient has a soft/non-tender abdomen,  and no oropharyngeal exudates.  There are no signs of meningismus.  I see no signs of an acute bacterial infection.   The patient's presentation is most consistent with a viral upper respiratory infection and acute bronchitis.  He is PERC positive in the setting of a cough and tachycardia, D-dimer ordered.  Screening labs ordered in addition to chest x-ray.   COVID-19, influenza, RSV PCR testing was collected and resulted negative.  CBC was without a leukocytosis or anemia.   X-ray without focal consolidation to suggest pneumonia, no evidence of pneumothorax.  D-dimer negative, low suspicion for PE.  Mildly elevated serum creatinine but no AKI.  Will treat with Tessalon and steroids for acute bronchitis.  I discussed symptomatic management, including hydration, motrin, and tylenol. The patient felt safe being discharged from the ED.  They agreed to followup with the PCP if needed.  I provided ED return  precautions.  Final Clinical Impression(s) / ED Diagnoses Final diagnoses:  Hemoptysis  Cough with hemoptysis  Acute bronchitis, unspecified organism    Rx / DC Orders ED Discharge Orders          Ordered    predniSONE (DELTASONE) 10 MG tablet  Daily        05/17/22 1926    benzonatate (TESSALON) 100 MG capsule  Every 8 hours        05/17/22 1926              Regan Lemming, MD 05/17/22 1928

## 2022-05-17 NOTE — ED Triage Notes (Signed)
Pt here with reports of cough and runny nose. Denies sick contacts. Denies fevers, chills. Pt in NAD.
